# Patient Record
Sex: Male | Born: 1975 | Race: White | Hispanic: No | Marital: Single | Smoking: Current every day smoker
Health system: Southern US, Community
[De-identification: ages and names within clinical notes are randomized; demographics above are authoritative.]

---

## 2016-02-13 ENCOUNTER — Emergency Department (HOSPITAL_COMMUNITY)
Admission: EM | Admit: 2016-02-13 | Discharge: 2016-02-13 | Disposition: A | Discharge status: Home / Self Care | Payer: Self-pay | Attending: Emergency Medicine | Admitting: Emergency Medicine

## 2016-02-13 ENCOUNTER — Emergency Department (HOSPITAL_COMMUNITY): Payer: Self-pay

## 2016-02-13 ENCOUNTER — Encounter (HOSPITAL_COMMUNITY): Payer: Self-pay

## 2016-02-13 DIAGNOSIS — F17210 Nicotine dependence, cigarettes, uncomplicated: Secondary | ICD-10-CM | POA: Insufficient documentation

## 2016-02-13 DIAGNOSIS — R1031 Right lower quadrant pain: Secondary | ICD-10-CM | POA: Insufficient documentation

## 2016-02-13 DIAGNOSIS — R1032 Left lower quadrant pain: Secondary | ICD-10-CM | POA: Insufficient documentation

## 2016-02-13 DIAGNOSIS — R112 Nausea with vomiting, unspecified: Secondary | ICD-10-CM | POA: Insufficient documentation

## 2016-02-13 DIAGNOSIS — R102 Pelvic and perineal pain: Secondary | ICD-10-CM | POA: Insufficient documentation

## 2016-02-13 DIAGNOSIS — R1030 Lower abdominal pain, unspecified: Secondary | ICD-10-CM

## 2016-02-13 DIAGNOSIS — R300 Dysuria: Secondary | ICD-10-CM | POA: Insufficient documentation

## 2016-02-13 LAB — CBC
HCT: 44.6 % (ref 39.0–52.0)
Hemoglobin: 14.6 g/dL (ref 13.0–17.0)
MCH: 31.4 pg (ref 26.0–34.0)
MCHC: 32.7 g/dL (ref 30.0–36.0)
MCV: 95.9 fL (ref 78.0–100.0)
PLATELETS: 269 10*3/uL (ref 150–400)
RBC: 4.65 MIL/uL (ref 4.22–5.81)
RDW: 13.6 % (ref 11.5–15.5)
WBC: 8.2 10*3/uL (ref 4.0–10.5)

## 2016-02-13 LAB — COMPREHENSIVE METABOLIC PANEL
ALK PHOS: 46 U/L (ref 38–126)
ALT: 16 U/L — AB (ref 17–63)
AST: 22 U/L (ref 15–41)
Albumin: 4 g/dL (ref 3.5–5.0)
Anion gap: 7 (ref 5–15)
BILIRUBIN TOTAL: 0.5 mg/dL (ref 0.3–1.2)
BUN: 10 mg/dL (ref 6–20)
CALCIUM: 9 mg/dL (ref 8.9–10.3)
CO2: 27 mmol/L (ref 22–32)
CREATININE: 0.89 mg/dL (ref 0.61–1.24)
Chloride: 106 mmol/L (ref 101–111)
GFR calc Af Amer: >60 mL/min (ref >60)
Glucose, Bld: 119 mg/dL — ABNORMAL HIGH (ref 65–99)
Potassium: 3.8 mmol/L (ref 3.5–5.1)
Sodium: 140 mmol/L (ref 135–145)
TOTAL PROTEIN: 6.4 g/dL — AB (ref 6.5–8.1)

## 2016-02-13 LAB — URINALYSIS, ROUTINE W REFLEX MICROSCOPIC
BILIRUBIN URINE: NEGATIVE
Glucose, UA: NEGATIVE mg/dL
Hgb urine dipstick: NEGATIVE
KETONES UR: NEGATIVE mg/dL
LEUKOCYTES UA: NEGATIVE
NITRITE: NEGATIVE
PROTEIN: NEGATIVE mg/dL
Specific Gravity, Urine: 1.013 (ref 1.005–1.030)
pH: 6 (ref 5.0–8.0)

## 2016-02-13 LAB — LIPASE, BLOOD: Lipase: 19 U/L (ref 11–51)

## 2016-02-13 MED ORDER — SODIUM CHLORIDE 0.9 % IV BOLUS (SEPSIS)
1000.0000 mL | Freq: Once | INTRAVENOUS | Status: AC
  Administered 2016-02-13: 1000 mL via INTRAVENOUS

## 2016-02-13 MED ORDER — ONDANSETRON HCL 4 MG/2ML IJ SOLN
4.0000 mg | Freq: Once | INTRAMUSCULAR | Status: AC
  Administered 2016-02-13: 4 mg via INTRAVENOUS
  Filled 2016-02-13: qty 2

## 2016-02-13 MED ORDER — FENTANYL CITRATE (PF) 100 MCG/2ML IJ SOLN
50.0000 ug | Freq: Once | INTRAMUSCULAR | Status: AC
  Administered 2016-02-13: 50 ug via INTRAVENOUS
  Filled 2016-02-13: qty 2

## 2016-02-13 MED ORDER — ONDANSETRON 4 MG PO TBDP: 4.0000 mg | ORAL_TABLET | Freq: Three times a day (TID) | ORAL | 0 refills | Status: AC | PRN

## 2016-02-13 MED ORDER — IOPAMIDOL (ISOVUE-300) INJECTION 61%
INTRAVENOUS | Status: AC
  Administered 2016-02-13: 100 mL
  Filled 2016-02-13: qty 100

## 2016-02-13 MED ORDER — MORPHINE SULFATE (PF) 2 MG/ML IV SOLN
2.0000 mg | Freq: Once | INTRAVENOUS | Status: AC
  Administered 2016-02-13: 2 mg via INTRAVENOUS
  Filled 2016-02-13: qty 1

## 2016-02-13 NOTE — ED Notes (Signed)
PA at bedside.

## 2016-02-13 NOTE — Discharge Instructions (Signed)
Read the information below.   Your labs and imaging are re-assuring. Your pain improved with treatment.  I have prescribed zofran to help with the nausea and vomiting. You can take tylenol or motrin for pain relief. Be sure to stay well hydrated.  Use the prescribed medication as directed.  Please discuss all new medications with your pharmacist.   Follow up with your primary doctor for re-evaluation next week.  You may return to the Emergency Department at any time for worsening condition or any new symptoms that concern you. Return to ED if you develop fever, constant/localized abdominal pain, blood in stool, blood in urine, inability to keep food/fluids down.

## 2016-02-13 NOTE — ED Notes (Signed)
Pt still at CT.

## 2016-02-13 NOTE — ED Notes (Signed)
Pt sent to CT

## 2016-02-13 NOTE — ED Notes (Signed)
Unable to provide urine specimen at triage 

## 2016-02-13 NOTE — ED Provider Notes (Signed)
[facility]-EMERGENCY DEPT Provider Note   CSN: [id number] Arrival date & time: 02/13/16  1101     History   Chief Complaint Chief Complaint  Patient presents with  . Abdominal Pain    HPI Ricardo Herman is a 40 y.o. male.  Ricardo Herman is a 40 y.o. male presents to ED with complaint of lower abdominal pain. Patient reports intermittent lower abdominal pain with radiation into back for the last two days. He describes the pain as a dull ache. He has associated pain with urination, nausea, and vomiting. Movement makes the pain worse. Laying flat improves the pain. He has tried tylenol and motrin with minimal relief. No diarrhea, constipation, blood in stool, rectal pain, pain with defecation, hematuria, penile pain, penile discharge, scrotal swelling, or scrotal pain. He denies fever, chills, diaphoresis, trouble swallowing, changes in vision, chest pain, shortness of breath, rash, numbness, weakness, loss of bowel or bladder function, or h/o cancer. No trauma. Pt s/p appendectomy.      History reviewed. No pertinent past medical history.  There are no active problems to display for this patient.   History reviewed. No pertinent surgical history.     Home Medications    Prior to Admission medications   Medication Sig Start Date End Date Taking? Authorizing Provider  ondansetron (ZOFRAN ODT) 4 MG disintegrating tablet Take 1 tablet (4 mg total) by mouth every 8 (eight) hours as needed for nausea or vomiting. 02/13/16   Lona Kettle, PA-C    Family History No family history on file.  Social History Social History  Substance Use Topics  . Smoking status: Current Every Day Smoker    Types: Cigarettes  . Smokeless tobacco: Never Used  . Alcohol use Yes     Allergies   Review of patient's allergies indicates no known allergies.   Review of Systems Review of Systems  Constitutional: Negative for chills, diaphoresis and fever.  HENT: Negative for trouble  swallowing.   Eyes: Negative for visual disturbance.  Respiratory: Negative for shortness of breath.   Cardiovascular: Negative for chest pain.  Gastrointestinal: Positive for abdominal pain, nausea and vomiting. Negative for blood in stool, constipation, diarrhea and rectal pain.  Genitourinary: Positive for dysuria. Negative for discharge, hematuria, penile pain, penile swelling, scrotal swelling and testicular pain.  Musculoskeletal: Negative for neck pain.  Skin: Negative for rash.  Neurological: Negative for syncope, weakness, numbness and headaches.     Physical Exam Updated Vital Signs BP 120/78 (BP Location: Left Arm)   Pulse 85   Temp 97.8 F (36.6 C)   Resp 16   Ht 5\' 7"  (1.702 m)   Wt 68 kg   SpO2 100%   BMI 23.49 kg/m   Physical Exam  Constitutional: He appears well-developed and well-nourished. No distress.  HENT:  Head: Normocephalic and atraumatic.  Mouth/Throat: Oropharynx is clear and moist. No oropharyngeal exudate.  Eyes: Conjunctivae and EOM are normal. Pupils are equal, round, and reactive to light. Right eye exhibits no discharge. Left eye exhibits no discharge. No scleral icterus.  Neck: Normal range of motion. Neck supple.  Cardiovascular: Regular rhythm, normal heart sounds and intact distal pulses.  Tachycardia present.   No murmur heard. Pulmonary/Chest: Effort normal and breath sounds normal. No respiratory distress.  Abdominal: Soft. Bowel sounds are normal. He exhibits no distension. There is tenderness in the right lower quadrant, suprapubic area and left lower quadrant. There is guarding. There is no rigidity, no rebound and no CVA tenderness.  TTP of  lower abdomen. Guarding appreciated. No rigidity. No peritoneal signs.   Genitourinary: Testes normal and penis normal. Circumcised.  Genitourinary Comments: Chaperone present for duration of exam. No testicular swelling, pain, or masses appreciated. No penile pain or rashes. No discharge.     Musculoskeletal: Normal range of motion.  Lymphadenopathy:    He has no cervical adenopathy.  Neurological: He is alert. Coordination normal.  Skin: Skin is warm and dry. He is not diaphoretic.  Psychiatric: He has a normal mood and affect. His behavior is normal.     ED Treatments / Results  Labs (all labs ordered are listed, but only abnormal results are displayed) Labs Reviewed  COMPREHENSIVE METABOLIC PANEL - Abnormal; Notable for the following:       Result Value   Glucose, Bld 119 (*)    Total Protein 6.4 (*)    ALT 16 (*)    All other components within normal limits  LIPASE, BLOOD  CBC  URINALYSIS, ROUTINE W REFLEX MICROSCOPIC (NOT AT [facility])    EKG  EKG Interpretation None       Radiology Ct Abdomen Pelvis W Contrast  Result Date: 02/13/2016 CLINICAL DATA:  Lower abdominal and bilateral flank pain x2 days, vomiting EXAM: CT ABDOMEN AND PELVIS WITH CONTRAST TECHNIQUE: Multidetector CT imaging of the abdomen and pelvis was performed using the standard protocol following bolus administration of intravenous contrast. CONTRAST:  100mL ISOVUE-300 IOPAMIDOL (ISOVUE-300) INJECTION 61% COMPARISON:  None. FINDINGS: Lower chest:  Lung bases are clear. Hepatobiliary: Liver is within normal limits. Gallbladder is unremarkable. No intrahepatic or extrahepatic ductal dilatation. Pancreas: Within normal limits. Spleen: Within normal limits. Adrenals/Urinary Tract: Adrenal glands are within normal limits. Kidneys are within normal limits.  No hydronephrosis. Bladder is within normal limits. Stomach/Bowel: Stomach is within normal limits. No evidence of bowel obstruction. Prior appendectomy (series 201/ image 63). Mild sigmoid diverticulosis, without evidence of diverticulitis. Vascular/Lymphatic: No evidence of abdominal aortic aneurysm. No suspicious abdominopelvic lymphadenopathy. Reproductive: Prostate is unremarkable. Other: No abdominopelvic ascites. Musculoskeletal: Visualized osseous  structures are within normal limits. IMPRESSION: No evidence of bowel obstruction.  Prior appendectomy. No CT findings to account for the patient's lower abdominal pain. Electronically Signed   By: Charline BillsSriyesh  Krishnan M.D.   On: 02/13/2016 17:03    Procedures Procedures (including critical care time)  Medications Ordered in ED Medications  sodium chloride 0.9 % bolus 1,000 mL (0 mLs Intravenous Stopped 02/13/16 1415)  ondansetron (ZOFRAN) injection 4 mg (4 mg Intravenous Given 02/13/16 1232)  morphine 2 MG/ML injection 2 mg (2 mg Intravenous Given 02/13/16 1234)  sodium chloride 0.9 % bolus 1,000 mL (0 mLs Intravenous Stopped 02/13/16 1708)  iopamidol (ISOVUE-300) 61 % injection (100 mLs  Contrast Given 02/13/16 1615)  fentaNYL (SUBLIMAZE) injection 50 mcg (50 mcg Intravenous Given 02/13/16 1711)     Initial Impression / Assessment and Plan / ED Course  I have reviewed the triage vital signs and the nursing notes.  Pertinent labs & imaging results that were available during my care of the patient were reviewed by me and considered in my medical decision making (see chart for details).  Clinical Course  Value Comment By Time  CT Abdomen Pelvis W Contrast Reviewed Lona Kettleshley Laurel Meyer, PA-C 09/02 1720   On re-evaluation patient endorses improvement in sxs. Mild TTP of abdomen.  Lona Kettleshley Laurel Meyer, [place] 09/02 1740    Patient presents to ED with complaint of lower abdominal pain. Patient is afebrile and non-toxic appearing in NAD. Vital signs remarkable  for slight tachycardia. TTP of lower abdomen with mild guarding. No peritoneal signs. No rashes. No CVA tenderness. Normal GU exam. Pt s/p appendectomy. Lipase nml - low suspicion for pancreatitis. CMP re-assuring - low suspicion for acute cholecystitis or cholangitis. CBC unremarkable. Concern for possible UTI vs. Pyelonephritis vs. diverticulitis. U/A negative for UTI. Will CT abdomen/pelvis for further evaluation. IVF, anti-emetics, and pain medication  initiated.    CT abdomen/pelvis negative for acute intracranial process - no evidence of obstruction, perforation, or diverticulitis. On re-evaluation patient endorses improvement in sxs. On repeat exam, mild TTP, no peritoneal signs.Vital signs stable. Patient does not meet SIRS or Sepsis criteria. Patient has had food and drink in ED without issue. Patient is requesting to go home. Discussed results and plan. Patient discharged home with symptomatic treatment. Rx zofran. Encouraged follow up with PCP and resources provided for establishing PCP. Strict return precautions discussed. Patient voiced understanding and is agreeable.    Final Clinical Impressions(s) / ED Diagnoses   Final diagnoses:  Lower abdominal pain  Non-intractable vomiting with nausea, vomiting of unspecified type    New Prescriptions Discharge Medication List as of 02/13/2016  6:12 PM    START taking these medications   Details  ondansetron (ZOFRAN ODT) 4 MG disintegrating tablet Take 1 tablet (4 mg total) by mouth every 8 (eight) hours as needed for nausea or vomiting., Starting Sat 02/13/2016, Print         [place], [place] 02/13/16 2313    Derwood Kaplan, MD 02/14/16 [phone]

## 2016-02-13 NOTE — ED Triage Notes (Signed)
Patient complains of lower abdominal pain with radiation to bilateral flank intermittently x 2 days. Reports vomiting with same, no diarrhea and last BM yesterday. States that the pain rats from dull to sharp. Alert and oriented, NAD

## 2017-08-14 IMAGING — CT CT ABD-PELV W/ CM
2 of 5 series · 12 of 46 positions shown, 14 images · IV contrast (iopamidol)
Comparison: None.

CLINICAL DATA: Lower abdominal and bilateral flank pain x2 days,
vomiting

EXAM:
CT ABDOMEN AND PELVIS WITH CONTRAST
TECHNIQUE: Multidetector CT imaging of the abdomen and pelvis was performed
using the standard protocol following bolus administration of
intravenous contrast.
CONTRAST:  100mL [id number] IOPAMIDOL ([id number]) INJECTION 61%

[Series 201: routine, idose (2) · axial · 0.69mm/px · z∈[-606,-206]mm · 9 of 92 slices shown, 11 images]
[im 6/92  soft-tissue]
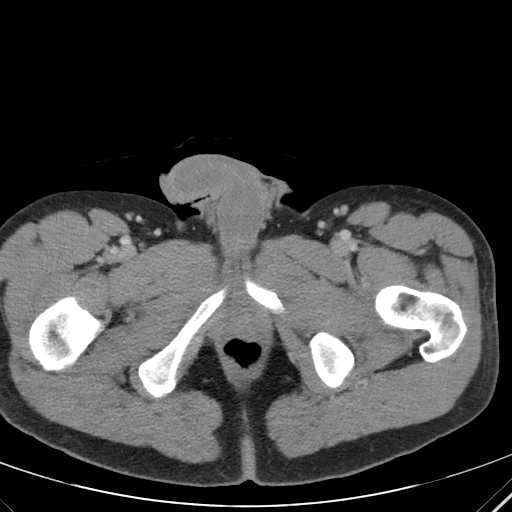
[im 6/92  bone]
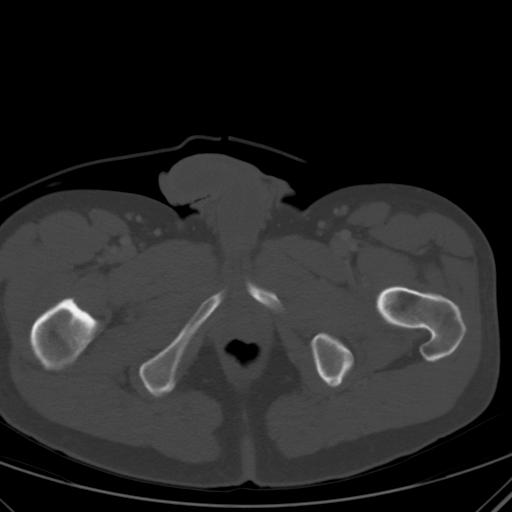
[im 16/92  soft-tissue]
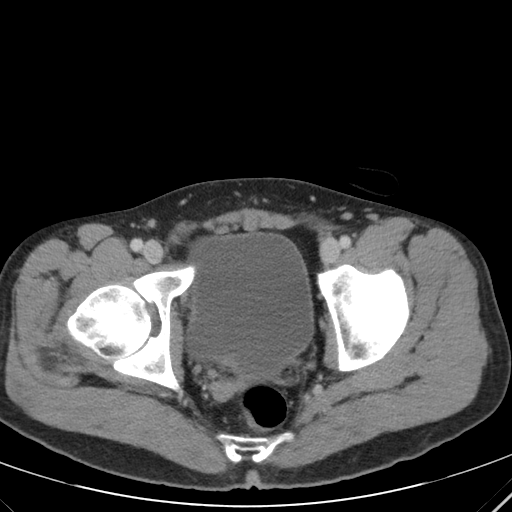
[im 26/92  soft-tissue]
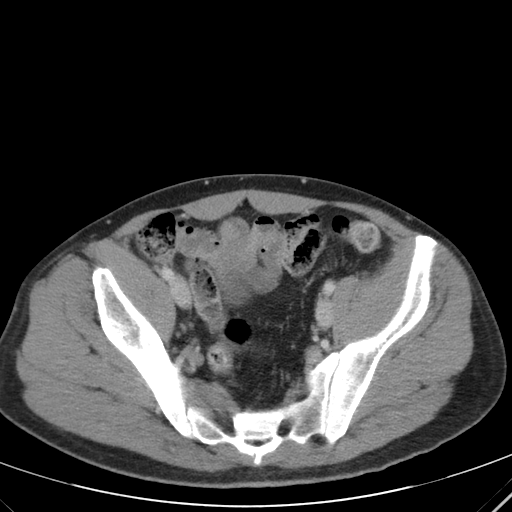
[im 36/92  soft-tissue]
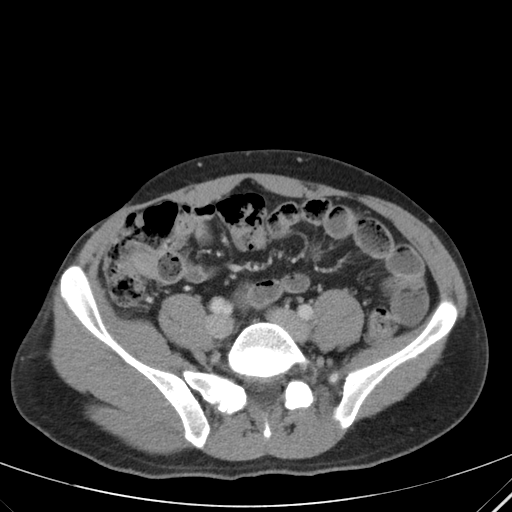
[im 46/92  soft-tissue]
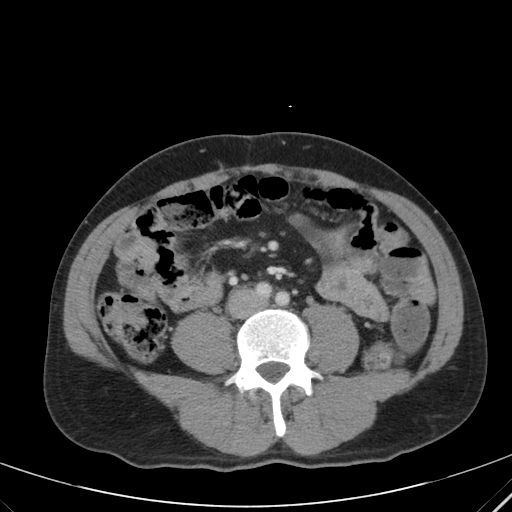
[im 56/92  soft-tissue]
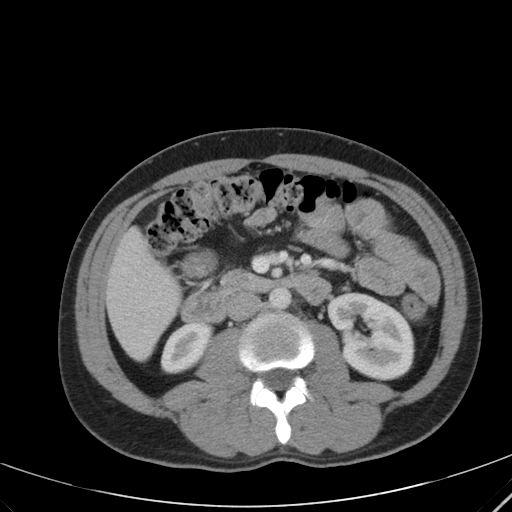
[im 66/92  soft-tissue]
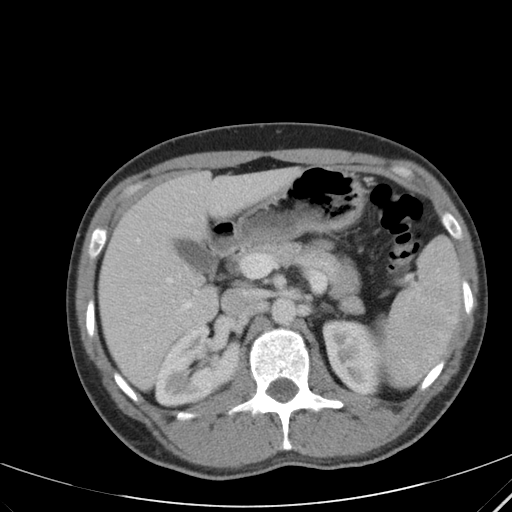
[im 76/92  soft-tissue]
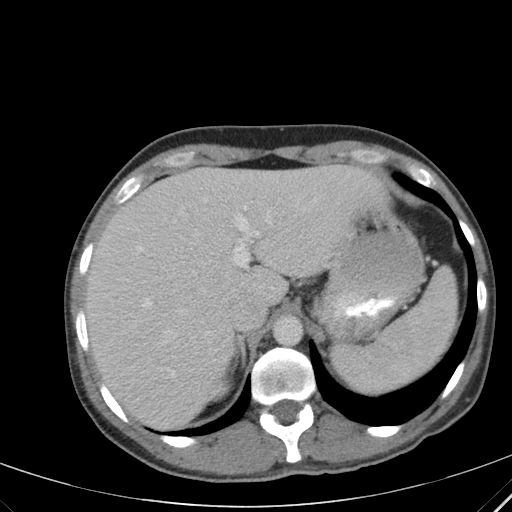
[im 86/92  soft-tissue]
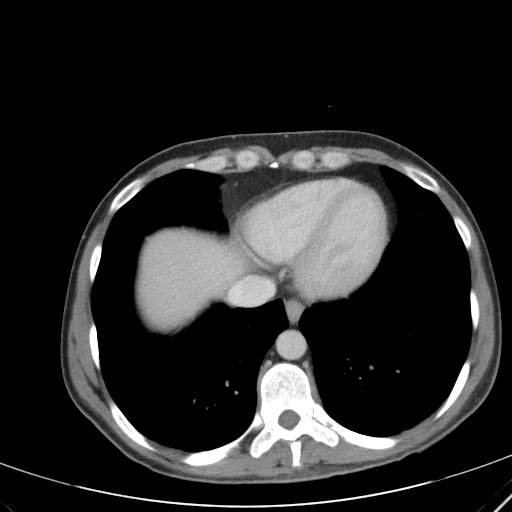
[im 86/92  bone]
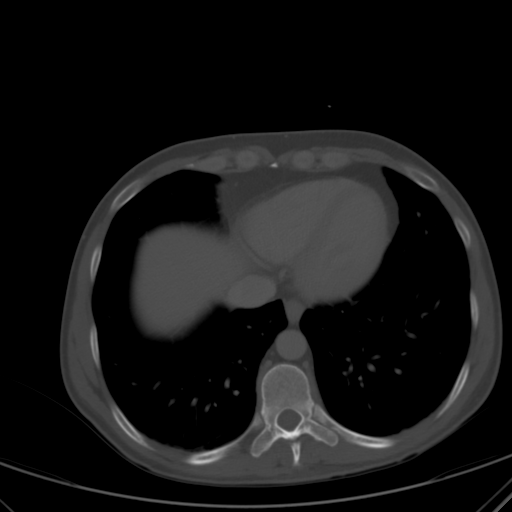

[Series 203: coronals, idose (2) · coronal · 0.45mm/px · 3 of 106 slices shown]
[im 36/106  soft-tissue]
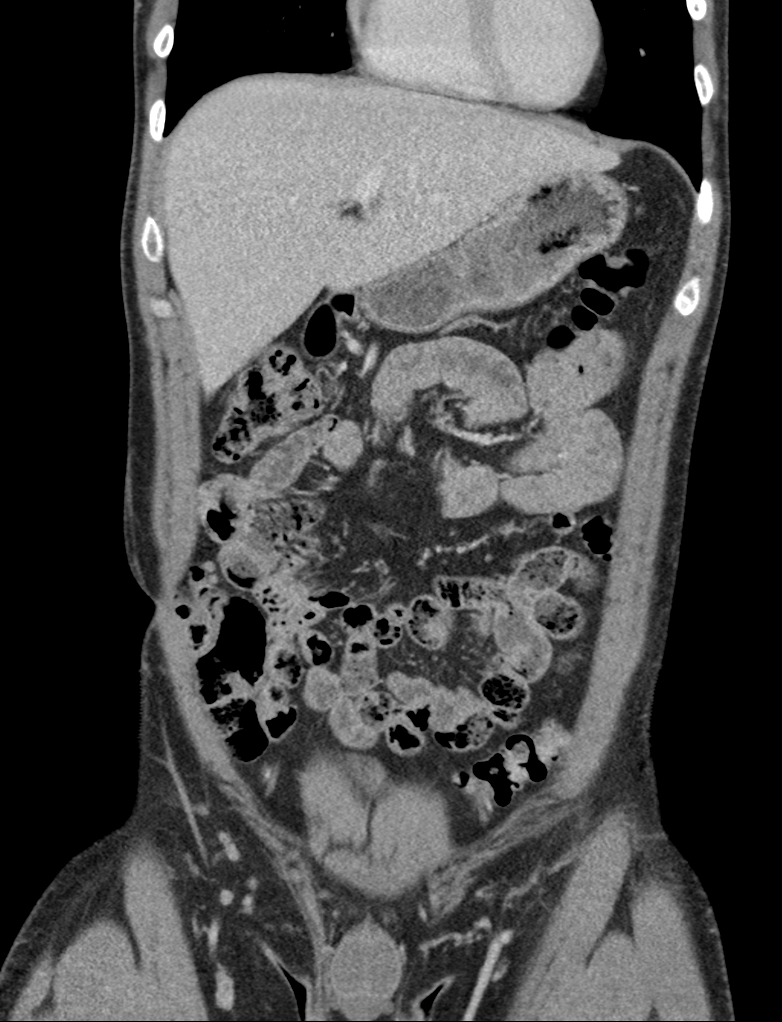
[im 47/106  soft-tissue]
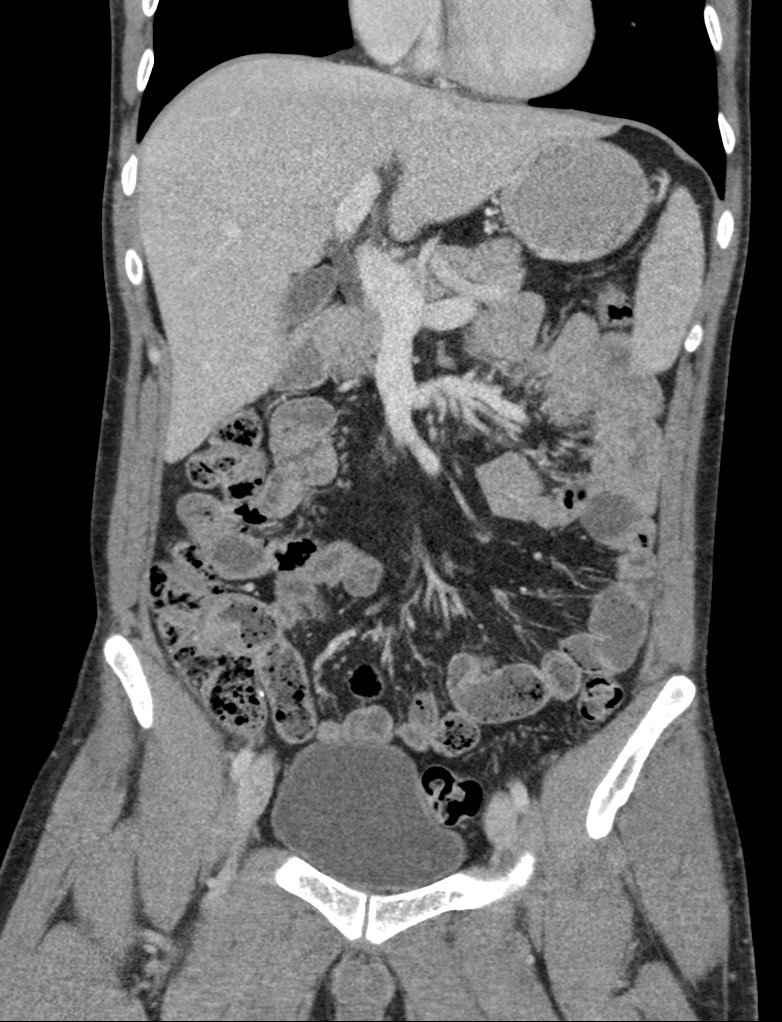
[im 59/106  soft-tissue]
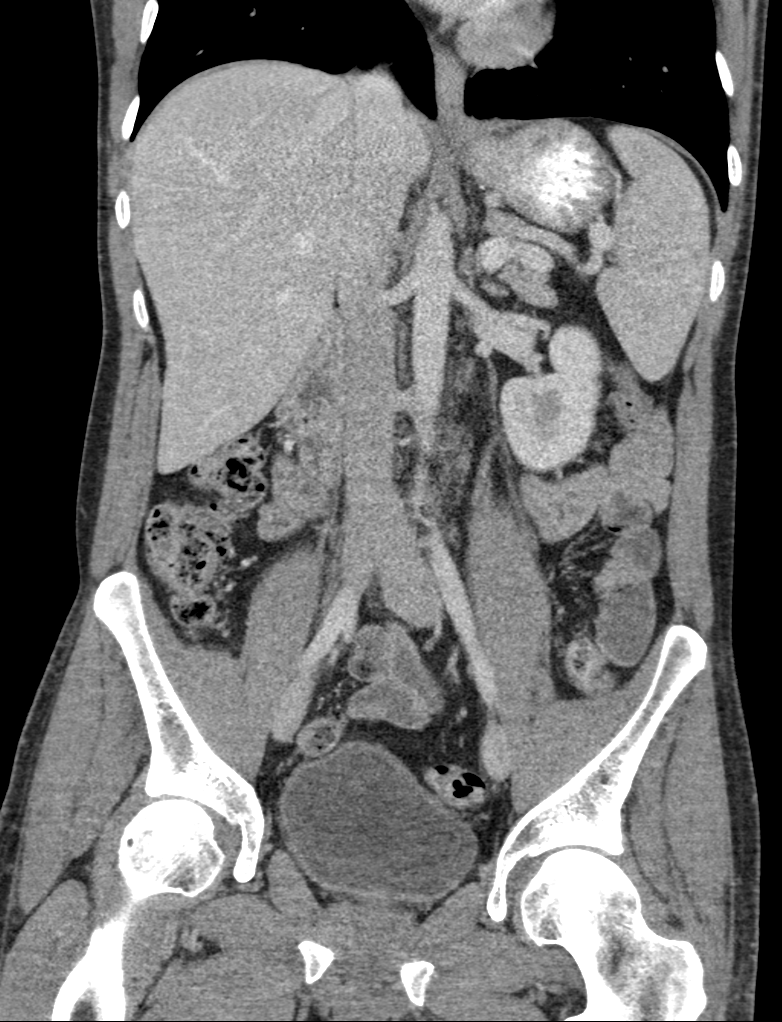

[12 of 46 positions shown; findings below may reference images not displayed]

FINDINGS: Lower chest:  Lung bases are clear.

Hepatobiliary: Liver is within normal limits.

Gallbladder is unremarkable. No intrahepatic or extrahepatic ductal
dilatation.

Pancreas: Within normal limits.

Spleen: Within normal limits.

Adrenals/Urinary Tract: Adrenal glands are within normal limits.

Kidneys are within normal limits.  No hydronephrosis.

Bladder is within normal limits.

Stomach/Bowel: Stomach is within normal limits.

No evidence of bowel obstruction.

Prior appendectomy (series 201/ image 63).

Mild sigmoid diverticulosis, without evidence of diverticulitis.

Vascular/Lymphatic: No evidence of abdominal aortic aneurysm.

No suspicious abdominopelvic lymphadenopathy.

Reproductive: Prostate is unremarkable.

Other: No abdominopelvic ascites.

Musculoskeletal: Visualized osseous structures are within normal
limits.
IMPRESSION: No evidence of bowel obstruction.  Prior appendectomy.

No CT findings to account for the patient's lower abdominal pain.
